# Patient Record
Sex: Male | Born: 1995 | Race: Black or African American | Hispanic: No | Marital: Single | Smoking: Never smoker
Health system: Southern US, Community
[De-identification: ages and names within clinical notes are randomized; demographics above are authoritative.]

## PROBLEM LIST (undated history)

## (undated) DIAGNOSIS — Z9359 Other cystostomy status: Secondary | ICD-10-CM

## (undated) DIAGNOSIS — W3400XA Accidental discharge from unspecified firearms or gun, initial encounter: Secondary | ICD-10-CM

## (undated) DIAGNOSIS — R569 Unspecified convulsions: Secondary | ICD-10-CM

## (undated) DIAGNOSIS — F909 Attention-deficit hyperactivity disorder, unspecified type: Secondary | ICD-10-CM

## (undated) HISTORY — PX: BLADDER SURGERY: SHX569

---

## 2014-11-21 ENCOUNTER — Emergency Department (HOSPITAL_COMMUNITY)
Admission: EM | Admit: 2014-11-21 | Discharge: 2014-11-21 | Disposition: A | Discharge status: Home / Self Care | Payer: Self-pay | Attending: Emergency Medicine | Admitting: Emergency Medicine

## 2014-11-21 ENCOUNTER — Emergency Department (HOSPITAL_COMMUNITY): Payer: Self-pay

## 2014-11-21 ENCOUNTER — Encounter (HOSPITAL_COMMUNITY): Payer: Self-pay | Admitting: *Deleted

## 2014-11-21 DIAGNOSIS — T8383XA Hemorrhage of genitourinary prosthetic devices, implants and grafts, initial encounter: Secondary | ICD-10-CM | POA: Insufficient documentation

## 2014-11-21 DIAGNOSIS — Z8659 Personal history of other mental and behavioral disorders: Secondary | ICD-10-CM | POA: Insufficient documentation

## 2014-11-21 DIAGNOSIS — Y846 Urinary catheterization as the cause of abnormal reaction of the patient, or of later complication, without mention of misadventure at the time of the procedure: Secondary | ICD-10-CM | POA: Insufficient documentation

## 2014-11-21 DIAGNOSIS — W3400XD Accidental discharge from unspecified firearms or gun, subsequent encounter: Secondary | ICD-10-CM

## 2014-11-21 DIAGNOSIS — R319 Hematuria, unspecified: Secondary | ICD-10-CM | POA: Insufficient documentation

## 2014-11-21 DIAGNOSIS — Z8669 Personal history of other diseases of the nervous system and sense organs: Secondary | ICD-10-CM | POA: Insufficient documentation

## 2014-11-21 DIAGNOSIS — S71101D Unspecified open wound, right thigh, subsequent encounter: Secondary | ICD-10-CM | POA: Insufficient documentation

## 2014-11-21 DIAGNOSIS — S71131D Puncture wound without foreign body, right thigh, subsequent encounter: Secondary | ICD-10-CM

## 2014-11-21 DIAGNOSIS — S31109D Unspecified open wound of abdominal wall, unspecified quadrant without penetration into peritoneal cavity, subsequent encounter: Secondary | ICD-10-CM | POA: Insufficient documentation

## 2014-11-21 DIAGNOSIS — Z9889 Other specified postprocedural states: Secondary | ICD-10-CM | POA: Insufficient documentation

## 2014-11-21 DIAGNOSIS — R109 Unspecified abdominal pain: Secondary | ICD-10-CM

## 2014-11-21 DIAGNOSIS — S31139D Puncture wound of abdominal wall without foreign body, unspecified quadrant without penetration into peritoneal cavity, subsequent encounter: Secondary | ICD-10-CM

## 2014-11-21 HISTORY — DX: Other cystostomy status: Z93.59

## 2014-11-21 HISTORY — DX: Attention-deficit hyperactivity disorder, unspecified type: F90.9

## 2014-11-21 HISTORY — DX: Accidental discharge from unspecified firearms or gun, initial encounter: W34.00XA

## 2014-11-21 HISTORY — DX: Unspecified convulsions: R56.9

## 2014-11-21 LAB — TYPE AND SCREEN
ABO/RH(D): A POS
Antibody Screen: NEGATIVE

## 2014-11-21 LAB — COMPREHENSIVE METABOLIC PANEL
ALK PHOS: 56 U/L (ref 38–126)
ALT: 15 U/L — ABNORMAL LOW (ref 17–63)
AST: 22 U/L (ref 15–41)
Albumin: 4.1 g/dL (ref 3.5–5.0)
Anion gap: 12 (ref 5–15)
BUN: 16 mg/dL (ref 6–20)
CHLORIDE: 99 mmol/L — AB (ref 101–111)
CO2: 26 mmol/L (ref 22–32)
Calcium: 9.6 mg/dL (ref 8.9–10.3)
Creatinine, Ser: 1.01 mg/dL (ref 0.61–1.24)
GFR calc non Af Amer: >60 mL/min (ref >60)
Glucose, Bld: 106 mg/dL — ABNORMAL HIGH (ref 65–99)
POTASSIUM: 4 mmol/L (ref 3.5–5.1)
Sodium: 137 mmol/L (ref 135–145)
Total Bilirubin: 0.8 mg/dL (ref 0.3–1.2)
Total Protein: 7.9 g/dL (ref 6.5–8.1)

## 2014-11-21 LAB — URINALYSIS, ROUTINE W REFLEX MICROSCOPIC
BILIRUBIN URINE: NEGATIVE
GLUCOSE, UA: NEGATIVE mg/dL
KETONES UR: NEGATIVE mg/dL
Leukocytes, UA: NEGATIVE
Nitrite: NEGATIVE
PH: 7.5 (ref 5.0–8.0)
Protein, ur: NEGATIVE mg/dL
SPECIFIC GRAVITY, URINE: 1.015 (ref 1.005–1.030)
Urobilinogen, UA: 0.2 mg/dL (ref 0.0–1.0)

## 2014-11-21 LAB — CBC WITH DIFFERENTIAL/PLATELET
Basophils Absolute: 0 10*3/uL (ref 0.0–0.1)
Basophils Relative: 0 % (ref 0–1)
EOS PCT: 0 % (ref 0–5)
Eosinophils Absolute: 0 10*3/uL (ref 0.0–0.7)
HCT: 31.8 % — ABNORMAL LOW (ref 39.0–52.0)
Hemoglobin: 10.5 g/dL — ABNORMAL LOW (ref 13.0–17.0)
LYMPHS ABS: 2.5 10*3/uL (ref 0.7–4.0)
LYMPHS PCT: 25 % (ref 12–46)
MCH: 26.9 pg (ref 26.0–34.0)
MCHC: 33 g/dL (ref 30.0–36.0)
MCV: 81.5 fL (ref 78.0–100.0)
MONO ABS: 0.9 10*3/uL (ref 0.1–1.0)
MONOS PCT: 9 % (ref 3–12)
Neutro Abs: 6.6 10*3/uL (ref 1.7–7.7)
Neutrophils Relative %: 66 % (ref 43–77)
Platelets: 302 10*3/uL (ref 150–400)
RBC: 3.9 MIL/uL — AB (ref 4.22–5.81)
RDW: 13.1 % (ref 11.5–15.5)
WBC: 10 10*3/uL (ref 4.0–10.5)

## 2014-11-21 LAB — URINE MICROSCOPIC-ADD ON

## 2014-11-21 LAB — I-STAT CG4 LACTIC ACID, ED: Lactic Acid, Venous: 1.4 mmol/L (ref 0.5–2.0)

## 2014-11-21 MED ORDER — SODIUM CHLORIDE 0.9 % IV SOLN
1000.0000 mL | INTRAVENOUS | Status: DC
  Administered 2014-11-21: 1000 mL via INTRAVENOUS

## 2014-11-21 MED ORDER — HYDROMORPHONE HCL 1 MG/ML IJ SOLN
1.0000 mg | Freq: Once | INTRAMUSCULAR | Status: AC
  Administered 2014-11-21: 1 mg via INTRAVENOUS
  Filled 2014-11-21: qty 1

## 2014-11-21 MED ORDER — OXYCODONE-ACETAMINOPHEN 5-325 MG PO TABS: 1.0000 | ORAL_TABLET | ORAL | Status: AC | PRN

## 2014-11-21 MED ORDER — IOHEXOL 350 MG/ML SOLN
100.0000 mL | Freq: Once | INTRAVENOUS | Status: AC | PRN
  Administered 2014-11-21: 100 mL via INTRAVENOUS

## 2014-11-21 MED ORDER — SODIUM CHLORIDE 0.9 % IV SOLN
1000.0000 mL | Freq: Once | INTRAVENOUS | Status: AC
  Administered 2014-11-21: 1000 mL via INTRAVENOUS

## 2014-11-21 MED ORDER — ONDANSETRON HCL 4 MG/2ML IJ SOLN
4.0000 mg | Freq: Once | INTRAMUSCULAR | Status: AC
  Administered 2014-11-21: 4 mg via INTRAVENOUS
  Filled 2014-11-21: qty 2

## 2014-11-21 NOTE — ED Notes (Signed)
Discharge instructions given, pt demonstrated teach back and verbal understanding. No concerns voiced.  
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
## 2014-11-21 NOTE — Discharge Instructions (Signed)
Follow up with the doctors at [facility] [place], as scheduled.  Acetaminophen; Oxycodone tablets What is this medicine? ACETAMINOPHEN; OXYCODONE (a set a MEE noe fen; ox i KOE done) is a pain reliever. It is used to treat mild to moderate pain. This medicine may be used for other purposes; ask your health care provider or pharmacist if you have questions. COMMON BRAND NAME(S): Endocet, Magnacet, Narvox, Percocet, Perloxx, Primalev, Primlev, Roxicet, Xolox What should I tell my health care provider before I take this medicine? They need to know if you have any of these conditions: -brain tumor -Crohn's disease, inflammatory bowel disease, or ulcerative colitis -drug abuse or addiction -head injury -heart or circulation problems -if you often drink alcohol -kidney disease or problems going to the bathroom -liver disease -lung disease, asthma, or breathing problems -an unusual or allergic reaction to acetaminophen, oxycodone, other opioid analgesics, other medicines, foods, dyes, or preservatives -pregnant or trying to get pregnant -breast-feeding How should I use this medicine? Take this medicine by mouth with a full glass of water. Follow the directions on the prescription label. Take your medicine at regular intervals. Do not take your medicine more often than directed. Talk to your pediatrician regarding the use of this medicine in children. Special care may be needed. Patients over 49 years old may have a stronger reaction and need a smaller dose. Overdosage: If you think you have taken too much of this medicine contact a poison control center or emergency room at once. NOTE: This medicine is only for you. Do not share this medicine with others. What if I miss a dose? If you miss a dose, take it as soon as you can. If it is almost time for your next dose, take only that dose. Do not take double or extra doses. What may interact with this  medicine? -alcohol -antihistamines -barbiturates like amobarbital, butalbital, butabarbital, methohexital, pentobarbital, phenobarbital, thiopental, and secobarbital -benztropine -drugs for bladder problems like solifenacin, trospium, oxybutynin, tolterodine, hyoscyamine, and methscopolamine -drugs for breathing problems like ipratropium and tiotropium -drugs for certain stomach or intestine problems like propantheline, homatropine methylbromide, glycopyrrolate, atropine, belladonna, and dicyclomine -general anesthetics like etomidate, ketamine, nitrous oxide, propofol, desflurane, enflurane, halothane, isoflurane, and sevoflurane -medicines for depression, anxiety, or psychotic disturbances -medicines for sleep -muscle relaxants -naltrexone -narcotic medicines (opiates) for pain -phenothiazines like perphenazine, thioridazine, chlorpromazine, mesoridazine, fluphenazine, prochlorperazine, promazine, and trifluoperazine -scopolamine -tramadol -trihexyphenidyl This list may not describe all possible interactions. Give your health care provider a list of all the medicines, herbs, non-prescription drugs, or dietary supplements you use. Also tell them if you smoke, drink alcohol, or use illegal drugs. Some items may interact with your medicine. What should I watch for while using this medicine? Tell your doctor or health care professional if your pain does not go away, if it gets worse, or if you have new or a different type of pain. You may develop tolerance to the medicine. Tolerance means that you will need a higher dose of the medication for pain relief. Tolerance is normal and is expected if you take this medicine for a long time. Do not suddenly stop taking your medicine because you may develop a severe reaction. Your body becomes used to the medicine. This does NOT mean you are addicted. Addiction is a behavior related to getting and using a drug for a non-medical reason. If you have pain, you  have a medical reason to take pain medicine. Your doctor will tell you how much medicine to  take. If your doctor wants you to stop the medicine, the dose will be slowly lowered over time to avoid any side effects. You may get drowsy or dizzy. Do not drive, use machinery, or do anything that needs mental alertness until you know how this medicine affects you. Do not stand or sit up quickly, especially if you are an older patient. This reduces the risk of dizzy or fainting spells. Alcohol may interfere with the effect of this medicine. Avoid alcoholic drinks. There are different types of narcotic medicines (opiates) for pain. If you take more than one type at the same time, you may have more side effects. Give your health care provider a list of all medicines you use. Your doctor will tell you how much medicine to take. Do not take more medicine than directed. Call emergency for help if you have problems breathing. The medicine will cause constipation. Try to have a bowel movement at least every 2 to 3 days. If you do not have a bowel movement for 3 days, call your doctor or health care professional. Do not take Tylenol (acetaminophen) or medicines that have acetaminophen with this medicine. Too much acetaminophen can be very dangerous. Many nonprescription medicines contain acetaminophen. Always read the labels carefully to avoid taking more acetaminophen. What side effects may I notice from receiving this medicine? Side effects that you should report to your doctor or health care professional as soon as possible: -allergic reactions like skin rash, itching or hives, swelling of the face, lips, or tongue -breathing difficulties, wheezing -confusion -light headedness or fainting spells -severe stomach pain -unusually weak or tired -yellowing of the skin or the whites of the eyes Side effects that usually do not require medical attention (report to your doctor or health care professional if they continue  or are bothersome): -dizziness -drowsiness -nausea -vomiting This list may not describe all possible side effects. Call your doctor for medical advice about side effects. You may report side effects to FDA at 1-800-FDA-1088. Where should I keep my medicine? Keep out of the reach of children. This medicine can be abused. Keep your medicine in a safe place to protect it from theft. Do not share this medicine with anyone. Selling or giving away this medicine is dangerous and against the law. Store at room temperature between 20 and 25 degrees C (68 and 77 degrees F). Keep container tightly closed. Protect from light. This medicine may cause accidental overdose and death if it is taken by other adults, children, or pets. Flush any unused medicine down the toilet to reduce the chance of harm. Do not use the medicine after the expiration date. NOTE: This sheet is a summary. It may not cover all possible information. If you have questions about this medicine, talk to your doctor, pharmacist, or health care provider.  2015, [employer]vier/[employer] Standard. (2012-12-01 13:17:35)

## 2014-11-21 NOTE — ED Notes (Signed)
Pt reports that he is on several meds, unsure of dosage when RN attempted to ask pt about hx, meds, pt states that he is hurting and does not want to answer any more questions,
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
## 2014-11-23 MED FILL — Oxycodone w/ Acetaminophen Tab 5-325 MG: ORAL | Qty: 6 | Status: AC
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
